# Patient Record
Sex: Male | Born: 2000 | Race: White | Hispanic: No | Marital: Single | Smoking: Never smoker
Health system: Southern US, Community
[De-identification: ages and names within clinical notes are randomized; demographics above are authoritative.]

## PROBLEM LIST (undated history)

## (undated) DIAGNOSIS — G43909 Migraine, unspecified, not intractable, without status migrainosus: Secondary | ICD-10-CM

## (undated) HISTORY — DX: Migraine, unspecified, not intractable, without status migrainosus: G43.909

## (undated) HISTORY — PX: WISDOM TOOTH EXTRACTION: SHX21

## (undated) HISTORY — PX: TONSILLECTOMY: SUR1361

---
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
## 2019-03-15 ENCOUNTER — Other Ambulatory Visit: Payer: Self-pay

## 2019-03-18 ENCOUNTER — Ambulatory Visit (INDEPENDENT_AMBULATORY_CARE_PROVIDER_SITE_OTHER): Payer: Managed Care, Other (non HMO) | Admitting: Family Medicine

## 2019-03-18 ENCOUNTER — Encounter: Payer: Self-pay | Admitting: Family Medicine

## 2019-03-18 ENCOUNTER — Other Ambulatory Visit: Payer: Self-pay

## 2019-03-18 VITALS — BP 114/70 | HR 83 | Temp 98.4°F | Ht 67.75 in | Wt 229.0 lb

## 2019-03-18 DIAGNOSIS — Z0000 Encounter for general adult medical examination without abnormal findings: Secondary | ICD-10-CM

## 2019-03-18 NOTE — Patient Instructions (Addendum)

## 2019-03-18 NOTE — Progress Notes (Signed)
Subjective:     Patient ID: Isaac Carlson, male   DOB: 2000-02-14, 19 y.o.   MRN: [id number]  HPI Alastair is seen for physical exam.  He is currently working at [employer] part-time.  He plans to enroll in college again later.  He went to [facility] [facility] for a brief while.  He will need tetanus booster next year.  Declines flu vaccine.  No history of smoking.  No illicit drug use.  No other nicotine use.  He has gained substantial amount of weight this past year which she attributes to the quarantine.  He has made some big changes in terms of portion control and reducing snack foods over the past few weeks and started to see some weight loss.  He is also tried exercise more  Past Medical History:  Diagnosis Date  . Migraine    Past Surgical History:  Procedure Laterality Date  . TONSILLECTOMY    . WISDOM TOOTH EXTRACTION      reports that he has never smoked. He has never used smokeless tobacco. He reports that he does not drink alcohol or use drugs. family history is not on file. No Known Allergies   Review of Systems  Constitutional: Negative for activity change, appetite change, fatigue and fever.  HENT: Negative for congestion, ear pain and trouble swallowing.   Eyes: Negative for pain and visual disturbance.  Respiratory: Negative for cough, shortness of breath and wheezing.   Cardiovascular: Negative for chest pain and palpitations.  Gastrointestinal: Negative for abdominal distention, abdominal pain, blood in stool, constipation, diarrhea, nausea, rectal pain and vomiting.  Endocrine: Negative for polydipsia and polyuria.  Genitourinary: Negative for dysuria, hematuria and testicular pain.  Musculoskeletal: Negative for arthralgias and joint swelling.  Skin: Negative for rash.  Neurological: Negative for dizziness, syncope and headaches.  Hematological: Negative for adenopathy.  Psychiatric/Behavioral: Negative for confusion and dysphoric mood.        Objective:   Physical Exam Vitals reviewed.  Constitutional:      General: He is not in acute distress.    Appearance: He is well-developed.  HENT:     Head: Normocephalic and atraumatic.     Right Ear: External ear normal.     Left Ear: External ear normal.  Eyes:     Conjunctiva/sclera: Conjunctivae normal.     Pupils: Pupils are equal, round, and reactive to light.  Neck:     Thyroid: No thyromegaly.  Cardiovascular:     Rate and Rhythm: Normal rate and regular rhythm.     Heart sounds: Normal heart sounds. No murmur.  Pulmonary:     Effort: No respiratory distress.     Breath sounds: No wheezing or rales.  Abdominal:     General: Bowel sounds are normal. There is no distension.     Palpations: Abdomen is soft. There is no mass.     Tenderness: There is no abdominal tenderness. There is no guarding or rebound.  Musculoskeletal:     Cervical back: Normal range of motion and neck supple.  Lymphadenopathy:     Cervical: No cervical adenopathy.  Skin:    Findings: No rash.  Neurological:     Mental Status: He is alert and oriented to person, place, and time.     Cranial Nerves: No cranial nerve deficit.        Assessment:     Physical exam.  He has increased BMI of 35 with substantial weight gain during the past year as above.  No significant chronic medical problems    Plan:     -Discussed healthy weight loss options.  He is trying to do this through combination of exercise and calorie restriction -We offered labs and he declines -Offered flu vaccine but he declines -Tetanus booster by next year  Kristian Covey MD [facility] at [facility]

## 2019-05-10 ENCOUNTER — Ambulatory Visit: Payer: Managed Care, Other (non HMO)

## 2019-09-12 ENCOUNTER — Other Ambulatory Visit: Payer: Self-pay

## 2019-09-12 DIAGNOSIS — Z20822 Contact with and (suspected) exposure to covid-19: Secondary | ICD-10-CM

## 2019-09-13 LAB — SARS-COV-2, NAA 2 DAY TAT

## 2019-09-13 LAB — NOVEL CORONAVIRUS, NAA: SARS-CoV-2, NAA: DETECTED — AB

## 2019-09-17 ENCOUNTER — Encounter: Payer: Self-pay | Admitting: Family Medicine

## 2019-09-19 ENCOUNTER — Telehealth (INDEPENDENT_AMBULATORY_CARE_PROVIDER_SITE_OTHER): Payer: Managed Care, Other (non HMO) | Admitting: Family Medicine

## 2019-09-19 ENCOUNTER — Encounter: Payer: Self-pay | Admitting: Family Medicine

## 2019-09-19 DIAGNOSIS — U071 COVID-19: Secondary | ICD-10-CM

## 2019-09-19 NOTE — Progress Notes (Signed)
Virtual Visit via Video Note  I connected with Isaac Carlson  on 09/19/19 at  1:00 PM EDT by a video enabled telemedicine application and verified that I am speaking with the correct person using two identifiers.  Location patient: home, [place] Location provider:work or home office Persons participating in the virtual visit: patient, provider  I discussed the limitations of evaluation and management by telemedicine and the availability of in person appointments. The patient expressed understanding and agreed to proceed.   HPI:  Acute telemedicine visit for COVID19: -he, his mom and sister all have COVID19 -started 8 days ago -fevers initially, ha the first few days, sinus pressure, cough - but better today, body aches initially, loss of taste and smell -denies: SOB, CP, HA now, difficulty getting out of bed, inability to tol oral intake -overall feeling better now -was not vaccinated for COVID19 -denies any risk factors for COVID19   ROS: See pertinent positives and negatives per HPI.  Past Medical History:  Diagnosis Date  . Migraine     Past Surgical History:  Procedure Laterality Date  . TONSILLECTOMY    . WISDOM TOOTH EXTRACTION      History reviewed. No pertinent family history.  SOCIAL HX: see hpi   Current Outpatient Medications:  .  EPINEPHrine (EPIPEN) 0.3 mg/0.3 mL DEVI, Inject 0.3 mLs (0.3 mg total) into the muscle once., Disp: 1 Device, Rfl: 1 .  loratadine (CLARITIN) 5 MG chewable tablet, Chew 5 mg by mouth daily., Disp: , Rfl:  .  Pediatric Multiple Vit-C-FA (MULTIVITAMIN ANIMAL SHAPES, WITH CA/FA,) WITH C & FA CHEW, Chew 1 tablet by mouth daily., Disp: , Rfl:   EXAM:  VITALS per patient if applicable:  GENERAL: alert, oriented, appears well and in no acute distress  HEENT: atraumatic, conjunttiva clear, no obvious abnormalities on inspection of external nose and ears  NECK: normal movements of the head and neck  LUNGS: on inspection no signs of respiratory  distress, breathing rate appears normal, no obvious gross SOB, gasping or wheezing  CV: no obvious cyanosis  MS: moves all visible extremities without noticeable abnormality  PSYCH/NEURO: pleasant and cooperative, no obvious depression or anxiety, speech and thought processing grossly intact  ASSESSMENT AND PLAN:  Discussed the following assessment and plan:  Infection due to 2019 novel coronavirus  -we discussed possible serious and likely etiologies, options for evaluation and workup, limitations of telemedicine visit vs in person visit, treatment, treatment risks and precautions. Pt prefers to treat via telemedicine empirically rather then risking or undertaking an in person visit at this moment. Seems to have had milder symptoms with COVID19 and today reports is feel much better. Appear well during this video visit call. Discussed treatment options, course, potential complications, isolation and precautions. Work/School slipped offered:in patient insturctions Advised to seek prompt follow up telemedicine visit or in person care if worsening, new symptoms arise, or if is not improving with treatment.  Greater than 20 minutes spent on this visit. I discussed the assessment and treatment plan with the patient. The patient was provided an opportunity to ask questions and all were answered. The patient agreed with the plan and demonstrated an understanding of the instructions.   The patient was advised to call back or seek an in-person evaluation if the symptoms worsen or if the condition fails to improve as anticipated.   Terressa Koyanagi, DO

## 2019-09-19 NOTE — Patient Instructions (Signed)
I am glad you are feeling better.  Please stay home for a full 10 days from the onset of symptoms in isolation, except to seek medical care if needed.   Seek care promptly inperson or by telemedicine visit if your symptoms recur or new concerns arise.

## 2020-08-31 ENCOUNTER — Ambulatory Visit (INDEPENDENT_AMBULATORY_CARE_PROVIDER_SITE_OTHER): Payer: 59 | Admitting: Psychiatry

## 2020-08-31 ENCOUNTER — Encounter: Payer: Self-pay | Admitting: Psychiatry

## 2020-08-31 ENCOUNTER — Other Ambulatory Visit: Payer: Self-pay

## 2020-08-31 VITALS — BP 151/77 | HR 70 | Ht 69.0 in | Wt 231.0 lb

## 2020-08-31 DIAGNOSIS — F4010 Social phobia, unspecified: Secondary | ICD-10-CM | POA: Diagnosis not present

## 2020-08-31 MED ORDER — SERTRALINE HCL 50 MG PO TABS: ORAL_TABLET | ORAL | 1 refills | Status: DC

## 2020-08-31 MED ORDER — PROPRANOLOL HCL 20 MG PO TABS: ORAL_TABLET | ORAL | 1 refills | Status: DC

## 2020-08-31 NOTE — Progress Notes (Signed)
[facility] MD/PA/NP Initial Note  08/31/2020 7:22 PM Isaac Carlson  MRN:  [id number]  Chief Complaint:  Chief Complaint   Establish Care; Anxiety; Stress     HPI: Referrred by friend of mother's Isaac Carlson.   Previously seeing Isaac Carlson for therapy.  A lot of stress and anxiety about school and work.  Things that didn't bother him in the past. Gotten worse since last summer without a specific trigger.  Always been kind of timid and social anxiety at school is hard.  Example presenting in class.  Social anxiety as long as he can remember and hates group activities. At work is Production designer, theatre/television/film at [employer] and has to talk to people all day causing too much stress and may have to leave for 5 mins to collect himself.  Not opposed to meds. Occ panic and about 3-4 in the last 4 mos with N, shakey.   Social avvoidant.  Not much tgrouble with depression. Some days low motivation.  First semester of college hard to go to classes. At [employer] this semester for 2nd year. No problems with sleep.  Appetite is OK and energy.  Couple years before HS would get NV before school.  Negative GI work up resolved.  Visit Diagnosis:    ICD-10-CM   1. 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 Counseling  with Isaac Carlson over 3 mos.  Past Medical History: No other medical problems Past Medical History:  Diagnosis Date   Migraine     Past Surgical History:  Procedure Laterality Date   TONSILLECTOMY     WISDOM TOOTH EXTRACTION      Family Psychiatric History: Mo history of anxiety meds briefly. 1 younger sister without mental health problems. GF PTSD from Isaac Carlson.  Family History:  Family History  Problem Relation Age of Onset   Anxiety disorder Mother     Social History:  Social History   Socioeconomic History   Marital status: Single    Spouse name: Not on file    Number of children: Not on file   Years of education: Not on file   Highest education level: Not on file  Occupational History   Not on file  Tobacco Use   Smoking status: Never   Smokeless tobacco: Never  Vaping Use   Vaping Use: Never used  Substance and Sexual Activity   Alcohol use: No   Drug use: No   Sexual activity: Never  Other Topics Concern   Not on file  Social History Narrative   Has GF.   No kids   Social Determinants of Corporate investment banker Strain: Not on file  Food Insecurity: Not on file  Transportation Needs: Not on file  Physical Activity: Not on file  Stress: Not on file  Social Connections: Not on file    Allergies: No Known Allergies  Metabolic Disorder Labs: No results found for: HGBA1C, MPG No results found for: PROLACTIN No results found for: CHOL, TRIG, HDL, CHOLHDL, VLDL, LDLCALC Lab Results  Component Value Date   TSH 1.17 03/31/2017    Therapeutic Level Labs: No results found for: LITHIUM No results found for: VALPROATE No components found for:  CBMZ  Current Medications: Current Outpatient Medications  Medication Sig Dispense Refill   propranolol (INDERAL) 20 MG tablet 1-2 tablets twice daily as needed for anxiety 120 tablet 1  sertraline (ZOLOFT) 50 MG tablet 1/2 tablet daily for 1 week then 1 daily 30 tablet 1   EPINEPHrine (EPIPEN) 0.3 mg/0.3 mL DEVI Inject 0.3 mLs (0.3 mg total) into the muscle once. (Patient not taking: Reported on 08/31/2020) 1 Device 1   loratadine (CLARITIN) 5 MG chewable tablet Chew 5 mg by mouth daily. (Patient not taking: Reported on 08/31/2020)     Pediatric Multiple Vit-C-FA (MULTIVITAMIN ANIMAL SHAPES, WITH CA/FA,) WITH C & FA CHEW Chew 1 tablet by mouth daily. (Patient not taking: Reported on 08/31/2020)     No current facility-administered medications for this visit.    Medication Side Effects: none  Orders placed this visit:  No orders of the defined types were placed in this  encounter.   Psychiatric Specialty Exam:  Review of Systems  Constitutional:  Negative for diaphoresis, fatigue and unexpected weight change.  HENT:  Positive for congestion and sinus pain. Negative for facial swelling, hearing loss, tinnitus, trouble swallowing and voice change.   Eyes:  Negative for pain and visual disturbance.  Respiratory:  Negative for chest tightness, shortness of breath and wheezing.   Cardiovascular:  Negative for chest pain and palpitations.  Gastrointestinal:  Negative for abdominal distention, constipation, diarrhea, nausea and vomiting.  Endocrine: Negative for polyuria.  Genitourinary:  Negative for decreased urine volume, difficulty urinating, flank pain, frequency and urgency.  Musculoskeletal:  Negative for arthralgias, gait problem and neck pain.  Skin:  Negative for rash.  Allergic/Immunologic: Positive for environmental allergies.  Neurological:  Positive for dizziness and headaches. Negative for tremors and weakness.  Psychiatric/Behavioral:  Negative for agitation, behavioral problems, confusion, decreased concentration, dysphoric mood, hallucinations, sleep disturbance and suicidal ideas.    Blood pressure (!) 151/77, pulse 70, height 5\' 9"  (1.753 m), weight 231 lb (104.8 kg).Body mass index is 34.11 kg/m.  General Appearance: Casual and Well Groomed  Eye Contact:  Good  Speech:  Normal Rate  Volume:  Normal  Mood:  Anxious  Affect:  Appropriate and Constricted  Thought Process:  Coherent, Goal Directed, and Descriptions of Associations: Intact  Orientation:  Full (Time, Place, and Person)  Thought Content: Logical and Hallucinations: None   Suicidal Thoughts:  No  Homicidal Thoughts:  No  Memory:  WNL  Judgement:  Good  Insight:  Good  Psychomotor Activity:  Normal  Concentration:  Concentration: Good  Recall:  Good  Fund of Knowledge: Good  Language: Good  Assets:  Communication Skills Desire for Improvement Financial  Resources/Insurance Housing Intimacy Leisure Time Physical Health Resilience Social Support Talents/Skills Transportation Vocational/Educational  ADL's:  Intact  Cognition: WNL  Prognosis:  Good   Screenings:  [place] Office Visit from 03/18/2019 in [facility] [place] Office Visit from 03/12/2018 in [facility] [place]  PHQ-2 Total Score 2 0  PHQ-9 Total Score 9 0      MDQ negative for manic sx and pt denies manic history  Receiving Psychotherapy:  past history 05/2018  Treatment Plan/Recommendations: Greater than 50% of 45 min face to face time with patient was spent on counseling and coordination of care. We discussed  Extensive discussion about his primary diagnosis of social anxiety disorder with potential Covid occurring generalized anxiety disorder.  He also have performance anxiety disorder.  We discussed the treatment options for these conditions.  There is an FDA approved treatment option for social anxiety disorder which is sertraline.  Propranolol is often used prefer for performance anxiety and social anxiety disorder situationally.  We discussed the pros and cons of each medication as well as side effects of each medication.  We discussed the time course to improvement with each medication.  He agrees to start sertraline.  We will proceed with low dosages initially and if he does not have adequate response or significant response with the sertraline in the first month he should call back and we should increase the dose.  Sertraline 25 mg daily for 1 week then 50 mg daily  Propranolol 20 to 40 mg twice daily as needed anxiety.  Disc SE each  Disc SE in detail and SSRI withdrawal sx.  FU 6-8 weeks   Lauraine Rinne, MD

## 2020-09-22 ENCOUNTER — Other Ambulatory Visit: Payer: Self-pay | Admitting: Psychiatry

## 2020-09-22 DIAGNOSIS — F4010 Social phobia, unspecified: Secondary | ICD-10-CM

## 2020-10-21 ENCOUNTER — Ambulatory Visit (INDEPENDENT_AMBULATORY_CARE_PROVIDER_SITE_OTHER): Payer: 59 | Admitting: Psychiatry

## 2020-10-21 ENCOUNTER — Other Ambulatory Visit: Payer: Self-pay

## 2020-10-21 ENCOUNTER — Encounter: Payer: Self-pay | Admitting: Psychiatry

## 2020-10-21 DIAGNOSIS — F4010 Social phobia, unspecified: Secondary | ICD-10-CM

## 2020-10-21 NOTE — Progress Notes (Signed)
Isaac Carlson [id number] 2000-12-02 20 y.o.  Subjective:   Patient ID:  Isaac Carlson is a 20 y.o. (DOB January 19, 2001) male.  Chief Complaint:  Chief Complaint  Patient presents with   Follow-up   Anxiety    HPI [name] presents to the office today for follow-up of social anxiety disorder. Referred by a friend of his mothers, [name] and previously saw Delsa Bern for psychotherapy. Originally seen 08/31/2020.  10/21/2020 appointment with the following noted: At his first appointment he was prescribed sertraline 50 mg daily and propranolol 20 mg to 40 mg twice daily as needed anxiety. A little sleepy from sertraline 50 in AM.   Lately a little hard to stay asleep last 3 week but right back to sleep. 6-8 hours of sleep. Definitely at school anxiety is better esp walking around and into class.  Work is hectic ans stress there will cause anxiety worse.  Anxiety gradually better. Has used propranolol an hour before work 20 mg with some benefit.   Past Psychiatric Medication Trials: None.  PHQ2-9    Flowsheet Row Office Visit from 03/18/2019 in [facility] at [facility] from 03/12/2018 in [facility] at [facility] Total Score 2 0  PHQ-9 Total Score 9 0        Review of Systems:  Review of Systems  Gastrointestinal:  Negative for diarrhea.  Neurological:  Negative for tremors.   Medications: I have reviewed the patient's current medications.  Current Outpatient Medications  Medication Sig Dispense Refill   loratadine (CLARITIN) 5 MG chewable tablet Chew 5 mg by mouth daily.     propranolol (INDERAL) 20 MG tablet TAKE 1-2 TABLETS TWICE DAILY AS NEEDED FOR ANXIETY 120 tablet 0   sertraline (ZOLOFT) 50 MG tablet Take 1 tablet (50 mg total) by mouth daily. 90 tablet 0   EPINEPHrine (EPIPEN) 0.3 mg/0.3 mL DEVI Inject 0.3 mLs (0.3 mg total) into the muscle once. (Patient not taking: No sig reported) 1 Device 1    No current facility-administered medications for this visit.    Medication Side Effects: Sedation  Allergies: No Known Allergies  Past Medical History:  Diagnosis Date   Migraine     Past Medical History, Surgical history, Social history, and Family history were reviewed and updated as appropriate.   Please see review of systems for further details on the patient's review from today.   Objective:   Physical Exam:  There were no vitals taken for this visit.  Physical Exam Constitutional:      General: He is not in acute distress. Musculoskeletal:        General: No deformity.  Neurological:     Mental Status: He is alert and oriented to person, place, and time.     Coordination: Coordination normal.  Psychiatric:        Attention and Perception: Attention and perception normal. He does not perceive auditory or visual hallucinations.        Mood and Affect: Mood is anxious. Mood is not depressed. Affect is not labile, blunt, angry or inappropriate.        Speech: Speech normal.        Behavior: Behavior normal.        Thought Content: Thought content normal. Thought content is not paranoid or delusional. Thought content does not include homicidal or suicidal ideation. Thought content does not include homicidal or suicidal plan.        Cognition and Memory: Cognition and memory  normal.        Judgment: Judgment normal.     Comments: Insight intact    Lab Review:     Component Value Date/Time   NA 138 05/25/2017 1405   K 3.6 05/25/2017 1405   CL 104 05/25/2017 1405   CO2 24 05/25/2017 1405   GLUCOSE 87 05/25/2017 1405   BUN 9 05/25/2017 1405   CREATININE 0.85 05/25/2017 1405   CALCIUM 8.9 05/25/2017 1405   PROT 7.6 05/25/2017 1405   ALBUMIN 4.7 05/25/2017 1405   AST 21 05/25/2017 1405   ALT 32 05/25/2017 1405   ALKPHOS 54 05/25/2017 1405   BILITOT 0.9 05/25/2017 1405   GFRNONAA NOT CALCULATED 05/25/2017 1405   GFRAA NOT CALCULATED 05/25/2017 1405        Component Value Date/Time   WBC 8.4 05/25/2017 1405   RBC 5.21 05/25/2017 1405   HGB 16.3 (H) 05/25/2017 1405   HCT 47.3 05/25/2017 1405   PLT 267 05/25/2017 1405   MCV 90.8 05/25/2017 1405   MCH 31.3 05/25/2017 1405   MCHC 34.5 05/25/2017 1405   RDW 12.6 05/25/2017 1405   LYMPHSABS 2.0 03/31/2017 1158   MONOABS 0.6 03/31/2017 1158   EOSABS 0.2 03/31/2017 1158   BASOSABS 0.0 03/31/2017 1158    No results found for: POCLITH, LITHIUM   No results found for: PHENYTOIN, PHENOBARB, VALPROATE, CBMZ   .res Assessment: Plan:    Nero was seen today for follow-up and anxiety.  Diagnoses and all orders for this visit:  Social anxiety disorder   Switch sertraline to night and increase dose for better benefit for anxiety DT partial response. Increase to 75 mg then 100 mg if tolerated for better control.  Option increase propranolol 20-40 mg prn anxiety. Disc SE  FU 2 mos  Meredith Staggers, MD, DFAPA    Please see After Visit Summary for patient specific instructions.  No future appointments.  No orders of the defined types were placed in this encounter.   -------------------------------

## 2020-10-24 ENCOUNTER — Other Ambulatory Visit: Payer: Self-pay | Admitting: Psychiatry

## 2020-10-24 DIAGNOSIS — F4010 Social phobia, unspecified: Secondary | ICD-10-CM

## 2020-10-27 ENCOUNTER — Telehealth: Payer: Self-pay | Admitting: Psychiatry

## 2020-10-27 ENCOUNTER — Other Ambulatory Visit: Payer: Self-pay

## 2020-10-27 DIAGNOSIS — F4010 Social phobia, unspecified: Secondary | ICD-10-CM

## 2020-10-27 MED ORDER — SERTRALINE HCL 50 MG PO TABS: 50.0000 mg | ORAL_TABLET | Freq: Every day | ORAL | 0 refills | Status: DC

## 2020-10-27 NOTE — Telephone Encounter (Signed)
Rx sent 

## 2020-10-27 NOTE — Telephone Encounter (Signed)
Next visit is 12/31/20. Requesting refill for Sertraline 50 mg. He has been out of it for a few days. Pharmacy is:  [id number] - [place],  - [address]  Phone:  [phone]  Fax:  [phone]

## 2020-11-20 ENCOUNTER — Ambulatory Visit (INDEPENDENT_AMBULATORY_CARE_PROVIDER_SITE_OTHER): Payer: Managed Care, Other (non HMO) | Admitting: Family Medicine

## 2020-11-20 ENCOUNTER — Other Ambulatory Visit: Payer: Self-pay

## 2020-11-20 VITALS — BP 118/68 | HR 90 | Temp 98.2°F | Ht 69.0 in | Wt 234.1 lb

## 2020-11-20 DIAGNOSIS — Z0000 Encounter for general adult medical examination without abnormal findings: Secondary | ICD-10-CM | POA: Diagnosis not present

## 2020-11-20 NOTE — Progress Notes (Signed)
Established Patient Office Visit  Subjective:  Patient ID: Isaac Carlson, male    DOB: [birth year]  Age: 20 y.o. MRN: [id number]  CC:  Chief Complaint  Patient presents with   Annual Exam    HPI Isaac Carlson presents for physical exam.  He has history of some chronic social type anxiety and is followed by psychiatry for that.  Currently on sertraline and symptoms are stable.  He is currently attending local community college and also working part-time.  Last tetanus reportedly 10 years ago.  He plans to get flu vaccine possibly next week.  Declines hepatitis C testing.  Low risk.  Family history reviewed with no significant changes.  No premature heart disease or diabetes.  No cancer history.  Social history-single.  Non-smoker.  No illicit drug use.  Attends [employer].  Past Medical History:  Diagnosis Date   Migraine     Past Surgical History:  Procedure Laterality Date   TONSILLECTOMY     WISDOM TOOTH EXTRACTION      Family History  Problem Relation Age of Onset   Anxiety disorder Mother     Social History   Socioeconomic History   Marital status: Single    Spouse name: Not on file   Number of children: Not on file   Years of education: Not on file   Highest education level: Not on file  Occupational History   Not on file  Tobacco Use   Smoking status: Never   Smokeless tobacco: Never  Vaping Use   Vaping Use: Never used  Substance and Sexual Activity   Alcohol use: No   Drug use: No   Sexual activity: Never  Other Topics Concern   Not on file  Social History Narrative   Has GF.   No kids   Social Determinants of Corporate investment banker Strain: Not on file  Food Insecurity: Not on file  Transportation Needs: Not on file  Physical Activity: Not on file  Stress: Not on file  Social Connections: Not on file  Intimate Partner Violence: Not on file    Outpatient Medications Prior to Visit   Medication Sig Dispense Refill   EPINEPHrine (EPIPEN) 0.3 mg/0.3 mL DEVI Inject 0.3 mLs (0.3 mg total) into the muscle once. 1 Device 1   loratadine (CLARITIN) 5 MG chewable tablet Chew 5 mg by mouth daily.     propranolol (INDERAL) 20 MG tablet TAKE 1-2 TABLETS TWICE DAILY AS NEEDED FOR ANXIETY 120 tablet 2   sertraline (ZOLOFT) 50 MG tablet Take 1 tablet (50 mg total) by mouth daily. 90 tablet 0   No facility-administered medications prior to visit.    No Known Allergies  ROS Review of Systems  Constitutional:  Negative for activity change, appetite change, fatigue and fever.  HENT:  Negative for congestion, ear pain and trouble swallowing.   Eyes:  Negative for pain and visual disturbance.  Respiratory:  Negative for cough, shortness of breath and wheezing.   Cardiovascular:  Negative for chest pain and palpitations.  Gastrointestinal:  Negative for abdominal distention, abdominal pain, blood in stool, constipation, diarrhea, nausea, rectal pain and vomiting.  Endocrine: Negative for polydipsia and polyuria.  Genitourinary:  Negative for dysuria, hematuria and testicular pain.  Musculoskeletal:  Negative for arthralgias and joint swelling.  Skin:  Negative for rash.  Neurological:  Negative for dizziness, syncope and headaches.  Hematological:  Negative for adenopathy.  Psychiatric/Behavioral:  Negative for confusion and dysphoric mood.  Objective:    Physical Exam Constitutional:      General: He is not in acute distress.    Appearance: He is well-developed.  HENT:     Head: Normocephalic and atraumatic.     Ears:     Comments: Does have some cerumen right canal obstructing view of eardrum Eyes:     Conjunctiva/sclera: Conjunctivae normal.     Pupils: Pupils are equal, round, and reactive to light.  Neck:     Thyroid: No thyromegaly.  Cardiovascular:     Rate and Rhythm: Normal rate and regular rhythm.     Heart sounds: Normal heart sounds. No murmur  heard. Pulmonary:     Effort: No respiratory distress.     Breath sounds: No wheezing or rales.  Abdominal:     General: Bowel sounds are normal. There is no distension.     Palpations: Abdomen is soft. There is no mass.     Tenderness: There is no abdominal tenderness. There is no guarding or rebound.  Musculoskeletal:     Cervical back: Normal range of motion and neck supple.     Right lower leg: No edema.     Left lower leg: No edema.  Lymphadenopathy:     Cervical: No cervical adenopathy.  Skin:    Findings: No rash.  Neurological:     Mental Status: He is alert and oriented to person, place, and time.     Cranial Nerves: No cranial nerve deficit.    BP 118/68 (BP Location: Left Arm, Cuff Size: Normal)   Pulse 90   Temp 98.2 F (36.8 C) (Oral)   Ht 5\' 9"  (1.753 m)   Wt 234 lb 1.6 oz (106.2 kg)   SpO2 98%   BMI 34.57 kg/m  Wt Readings from Last 3 Encounters:  11/20/20 234 lb 1.6 oz (106.2 kg)  03/18/19 229 lb (103.9 kg) (98 %, Z= 2.06)*  03/12/18 189 lb 3.2 oz (85.8 kg) (90 %, Z= 1.27)*   * Growth percentiles are based on CDC (Boys, 2-20 Years) data.     Health Maintenance Due  Topic Date Due   COVID-19 Vaccine (1) Never done   HPV VACCINES (1 - Male 2-dose series) Never done   HIV Screening  Never done       Topic Date Due   HPV VACCINES (1 - Male 2-dose series) Never done    Lab Results  Component Value Date   TSH 1.17 03/31/2017   Lab Results  Component Value Date   WBC 8.4 05/25/2017   HGB 16.3 (H) 05/25/2017   HCT 47.3 05/25/2017   MCV 90.8 05/25/2017   PLT 267 05/25/2017   Lab Results  Component Value Date   NA 138 05/25/2017   K 3.6 05/25/2017   CO2 24 05/25/2017   GLUCOSE 87 05/25/2017   BUN 9 05/25/2017   CREATININE 0.85 05/25/2017   BILITOT 0.9 05/25/2017   ALKPHOS 54 05/25/2017   AST 21 05/25/2017   ALT 32 05/25/2017   PROT 7.6 05/25/2017   ALBUMIN 4.7 05/25/2017   CALCIUM 8.9 05/25/2017   ANIONGAP 10 05/25/2017   GFR 129.02  03/31/2017   No results found for: CHOL No results found for: HDL No results found for: LDLCALC No results found for: TRIG No results found for: CHOLHDL No results found for: 05/31/2017    Assessment & Plan:   Physical exam.  Generally doing well though he has gained some steady weight over the past couple years.  He has some chronic anxiety controlled with sertraline.  We discussed the following health maintenance items  -Needs tetanus booster.  He declines today but agrees to get this soon.  He was going out of town today and did not wish to get this at this time -Also recommended flu vaccine but he declines at this time -We discussed screening labs and he declines -We strongly challenged him to try to lose some weight through reducing overall calories and increasing exercise  No orders of the defined types were placed in this encounter.   Follow-up: No follow-ups on file.    Evelena Peat, MD

## 2020-11-20 NOTE — Patient Instructions (Signed)
Consider Tetanus booster at some point this year

## 2020-12-27 ENCOUNTER — Other Ambulatory Visit: Payer: Self-pay | Admitting: Psychiatry

## 2020-12-27 DIAGNOSIS — F4010 Social phobia, unspecified: Secondary | ICD-10-CM

## 2020-12-31 ENCOUNTER — Encounter: Payer: Self-pay | Admitting: Psychiatry

## 2020-12-31 ENCOUNTER — Other Ambulatory Visit: Payer: Self-pay

## 2020-12-31 ENCOUNTER — Ambulatory Visit (INDEPENDENT_AMBULATORY_CARE_PROVIDER_SITE_OTHER): Payer: 59 | Admitting: Psychiatry

## 2020-12-31 DIAGNOSIS — F4010 Social phobia, unspecified: Secondary | ICD-10-CM | POA: Diagnosis not present

## 2020-12-31 MED ORDER — SERTRALINE HCL 100 MG PO TABS: 150.0000 mg | ORAL_TABLET | Freq: Every day | ORAL | 1 refills | Status: DC

## 2020-12-31 NOTE — Progress Notes (Signed)
Isaac Carlson [id number] Mar 06, 2000 20 y.o.  Subjective:   Patient ID:  Isaac Carlson is a 20 y.o. (DOB March 09, 2000) male.  Chief Complaint:  Chief Complaint  Patient presents with   Follow-up   Anxiety    Social anxiety disorder    HPI [name] presents to the office today for follow-up of social anxiety disorder. Referred by a friend of his mothers, [name] and previously saw Maggie Font [facility] for psychotherapy. Originally seen 08/31/2020.  10/21/2020 appointment with the following noted: At his first appointment he was prescribed sertraline 50 mg daily and propranolol 20 mg to 40 mg twice daily as needed anxiety. A little sleepy from sertraline 50 in AM.   Lately a little hard to stay asleep last 3 week but right back to sleep. 6-8 hours of sleep. Definitely at school anxiety is better esp walking around and into class.  Work is hectic and stress there will cause anxiety worse.  Anxiety gradually better. Has used propranolol an hour before work 20 mg with some benefit. Plan: Switch sertraline to night and increase dose for better benefit for anxiety DT partial response. Increase to 75 mg then 100 mg if tolerated for better control. Option increase propranolol 20-40 mg prn anxiety.  12/31/2020 appointment with the following noted: Increased sertraline to 75 mg daily at HS. New job [employer]  2 weeks ago and likes it better so far.  More physical. SE less sleepy.  No other SE. Took propranolol last week for presentation and it helped at 20 mg . When goes out with friends and family not as worried as much socially. Still anxious at work to ask questions bc doesn't want to look stupid.   School ending this week and will complete this school.  May decide to go further. Less worry causing procrastination.  Past Psychiatric Medication Trials: None.  PHQ2-9    Flowsheet Row Office Visit from 11/20/2020 in [facility] at  [facility] from 03/18/2019 in [facility] at [facility] from 03/12/2018 in [facility] at [facility] Total Score 3 2 0  PHQ-9 Total Score 9 9 0        Review of Systems:  Review of Systems  Gastrointestinal:  Negative for diarrhea.  Neurological:  Negative for tremors.  Psychiatric/Behavioral:  The patient is nervous/anxious.    Medications: I have reviewed the patient's current medications.  Current Outpatient Medications  Medication Sig Dispense Refill   EPINEPHrine (EPIPEN) 0.3 mg/0.3 mL DEVI Inject 0.3 mLs (0.3 mg total) into the muscle once. 1 Device 1   loratadine (CLARITIN) 5 MG chewable tablet Chew 5 mg by mouth daily.     propranolol (INDERAL) 20 MG tablet TAKE 1-2 TABLETS TWICE DAILY AS NEEDED FOR ANXIETY 120 tablet 2   sertraline (ZOLOFT) 100 MG tablet Take 1.5 tablets (150 mg total) by mouth daily. 135 tablet 1   No current facility-administered medications for this visit.    Medication Side Effects: Sedation  Allergies: No Known Allergies  Past Medical History:  Diagnosis Date   Migraine     Past Medical History, Surgical history, Social history, and Family history were reviewed and updated as appropriate.   Please see review of systems for further details on the patient's review from today.   Objective:   Physical Exam:  There were no vitals taken for this visit.  Physical Exam Constitutional:      General: He is not in acute distress. Musculoskeletal:  General: No deformity.  Neurological:     Mental Status: He is alert and oriented to person, place, and time.     Coordination: Coordination normal.  Psychiatric:        Attention and Perception: Attention and perception normal. He does not perceive auditory or visual hallucinations.        Mood and Affect: Mood is anxious. Mood is not depressed. Affect is not labile, blunt, angry or inappropriate.        Speech: Speech normal.        Behavior:  Behavior normal.        Thought Content: Thought content normal. Thought content is not paranoid or delusional. Thought content does not include homicidal or suicidal ideation.        Cognition and Memory: Cognition and memory normal.        Judgment: Judgment normal.     Comments: Insight intact    Lab Review:     Component Value Date/Time   NA 138 05/25/2017 1405   K 3.6 05/25/2017 1405   CL 104 05/25/2017 1405   CO2 24 05/25/2017 1405   GLUCOSE 87 05/25/2017 1405   BUN 9 05/25/2017 1405   CREATININE 0.85 05/25/2017 1405   CALCIUM 8.9 05/25/2017 1405   PROT 7.6 05/25/2017 1405   ALBUMIN 4.7 05/25/2017 1405   AST 21 05/25/2017 1405   ALT 32 05/25/2017 1405   ALKPHOS 54 05/25/2017 1405   BILITOT 0.9 05/25/2017 1405   GFRNONAA NOT CALCULATED 05/25/2017 1405   GFRAA NOT CALCULATED 05/25/2017 1405       Component Value Date/Time   WBC 8.4 05/25/2017 1405   RBC 5.21 05/25/2017 1405   HGB 16.3 (H) 05/25/2017 1405   HCT 47.3 05/25/2017 1405   PLT 267 05/25/2017 1405   MCV 90.8 05/25/2017 1405   MCH 31.3 05/25/2017 1405   MCHC 34.5 05/25/2017 1405   RDW 12.6 05/25/2017 1405   LYMPHSABS 2.0 03/31/2017 1158   MONOABS 0.6 03/31/2017 1158   EOSABS 0.2 03/31/2017 1158   BASOSABS 0.0 03/31/2017 1158    No results found for: POCLITH, LITHIUM   No results found for: PHENYTOIN, PHENOBARB, VALPROATE, CBMZ   .res Assessment: Plan:    Khaleed was seen today for follow-up and anxiety.  Diagnoses and all orders for this visit:  Social anxiety disorder -     sertraline (ZOLOFT) 100 MG tablet; Take 1.5 tablets (150 mg total) by mouth daily.   Switch sertraline to night and increase dose for better benefit for anxiety DT partial response. Increase to 100 mg  and after a month if tolerated for better control can increase to 150 mg daily. In general this works better at higher doses for anxiety  Disc SE in detail.  Disc SE in detail and SSRI withdrawal sx.  continue increase  propranolol 20-40 mg prn anxiety. Disc SE  FU 2-3 mos  Meredith Staggers, MD, DFAPA    Please see After Visit Summary for patient specific instructions.  No future appointments.  No orders of the defined types were placed in this encounter.   -------------------------------

## 2021-03-10 ENCOUNTER — Other Ambulatory Visit: Payer: Self-pay

## 2021-03-10 ENCOUNTER — Ambulatory Visit (INDEPENDENT_AMBULATORY_CARE_PROVIDER_SITE_OTHER): Payer: 59 | Admitting: Psychiatry

## 2021-03-10 ENCOUNTER — Encounter: Payer: Self-pay | Admitting: Psychiatry

## 2021-03-10 DIAGNOSIS — F4010 Social phobia, unspecified: Secondary | ICD-10-CM

## 2021-03-10 MED ORDER — SERTRALINE HCL 100 MG PO TABS: 200.0000 mg | ORAL_TABLET | Freq: Every day | ORAL | 0 refills | Status: DC

## 2021-03-10 NOTE — Progress Notes (Signed)
Isaac Carlson [id number] 06-22-2000 20 y.o.  Subjective:   Patient ID:  Isaac Carlson is a 21 y.o. (DOB Nov 07, 2000) male.  Chief Complaint:  Chief Complaint  Patient presents with   Follow-up   Anxiety    HPI Isaac Carlson presents to the office today for follow-up of social anxiety disorder. Referred by a friend of his mothers, Isaac Carlson and previously saw Isaac Carlson [facility] for psychotherapy. Originally seen 08/31/2020.  10/21/2020 appointment with the following noted: At his first appointment he was prescribed sertraline 50 mg daily and propranolol 20 mg to 40 mg twice daily as needed anxiety. A little sleepy from sertraline 50 in AM.   Lately a little hard to stay asleep last 3 week but right back to sleep. 6-8 hours of sleep. Definitely at school anxiety is better esp walking around and into class.  Work is hectic and stress there will cause anxiety worse.  Anxiety gradually better. Has used propranolol an hour before work 20 mg with some benefit. Plan: Switch sertraline to night and increase dose for better benefit for anxiety DT partial response. Increase to 75 mg then 100 mg if tolerated for better control. Option increase propranolol 20-40 mg prn anxiety.  12/31/2020 appointment with the following noted: Increased sertraline to 75 mg daily at HS. New job [employer]  2 weeks ago and likes it better so far.  More physical. SE less sleepy.  No other SE. Took propranolol last week for presentation and it helped at 20 mg . When goes out with friends and family not as worried as much socially. Still anxious at work to ask questions bc doesn't want to look stupid.   School ending this week and will complete this school.  May decide to go further. Less worry causing procrastination. Plan: Switch sertraline to night and increase dose for better benefit for anxiety DT partial response. Increase to 100 mg  and after a month if tolerated for  better control can increase to 150 mg daily. continue increase propranolol 20-40 mg prn anxie  03/10/2021 appointment with the following noted: Increased sertraline 150 mg for about 6  weeks. Not much different.  Work is pretty easy socially.  Easier talking to people and good group of guys at work.   Still get sweaty and clammy socially.  Uses propranolol in that situation.  Usually just one.   Some issues with sleep lately.  Hard to stay asleep. Anxiety dropped from 8/10 to 45-5/10 with sertraline.   Past Psychiatric Medication Trials: Sertraline 150   PHQ2-9    Flowsheet Row Office Visit from 11/20/2020 in [facility] Office Visit from 03/18/2019 in [facility] Office Visit from 03/12/2018 in [facility] Total Score 3 2 0  PHQ-9 Total Score 9 9 0        Review of Systems:  Review of Systems  Constitutional:  Positive for diaphoresis.  Gastrointestinal:  Negative for diarrhea.  Neurological:  Negative for tremors.  Psychiatric/Behavioral:  The patient is nervous/anxious.    Medications: I have reviewed the patient's current medications.  Current Outpatient Medications  Medication Sig Dispense Refill   EPINEPHrine (EPIPEN) 0.3 mg/0.3 mL DEVI Inject 0.3 mLs (0.3 mg total) into the muscle once. 1 Device 1   loratadine (CLARITIN) 5 MG chewable tablet Chew 5 mg by mouth daily.     propranolol (INDERAL) 20 MG tablet TAKE 1-2 TABLETS TWICE DAILY AS NEEDED FOR ANXIETY 120 tablet  2   sertraline (ZOLOFT) 100 MG tablet Take 2 tablets (200 mg total) by mouth daily. 180 tablet 0   No current facility-administered medications for this visit.    Medication Side Effects: Sedation  Allergies: No Known Allergies  Past Medical History:  Diagnosis Date   Migraine     Past Medical History, Surgical history, Social history, and Family history were reviewed and updated as appropriate.   Please see review of systems for  further details on the patient's review from today.   Objective:   Physical Exam:  There were no vitals taken for this visit.  Physical Exam Constitutional:      General: He is not in acute distress. Musculoskeletal:        General: No deformity.  Neurological:     Mental Status: He is alert and oriented to person, place, and time.     Coordination: Coordination normal.  Psychiatric:        Attention and Perception: Attention and perception normal. He does not perceive auditory or visual hallucinations.        Mood and Affect: Mood is anxious. Mood is not depressed. Affect is not labile, blunt, angry or inappropriate.        Speech: Speech normal.        Behavior: Behavior normal.        Thought Content: Thought content normal. Thought content is not paranoid or delusional. Thought content does not include homicidal or suicidal ideation.        Cognition and Memory: Cognition and memory normal.        Judgment: Judgment normal.     Comments: Insight intact    Lab Review:     Component Value Date/Time   NA 138 05/25/2017 1405   K 3.6 05/25/2017 1405   CL 104 05/25/2017 1405   CO2 24 05/25/2017 1405   GLUCOSE 87 05/25/2017 1405   BUN 9 05/25/2017 1405   CREATININE 0.85 05/25/2017 1405   CALCIUM 8.9 05/25/2017 1405   PROT 7.6 05/25/2017 1405   ALBUMIN 4.7 05/25/2017 1405   AST 21 05/25/2017 1405   ALT 32 05/25/2017 1405   ALKPHOS 54 05/25/2017 1405   BILITOT 0.9 05/25/2017 1405   GFRNONAA NOT CALCULATED 05/25/2017 1405   GFRAA NOT CALCULATED 05/25/2017 1405       Component Value Date/Time   WBC 8.4 05/25/2017 1405   RBC 5.21 05/25/2017 1405   HGB 16.3 (H) 05/25/2017 1405   HCT 47.3 05/25/2017 1405   PLT 267 05/25/2017 1405   MCV 90.8 05/25/2017 1405   MCH 31.3 05/25/2017 1405   MCHC 34.5 05/25/2017 1405   RDW 12.6 05/25/2017 1405   LYMPHSABS 2.0 03/31/2017 1158   MONOABS 0.6 03/31/2017 1158   EOSABS 0.2 03/31/2017 1158   BASOSABS 0.0 03/31/2017 1158    No  results found for: POCLITH, LITHIUM   No results found for: PHENYTOIN, PHENOBARB, VALPROATE, CBMZ   .res Assessment: Plan:    [name] was seen today for follow-up and anxiety.  Diagnoses and all orders for this visit:  Social anxiety disorder -     sertraline (ZOLOFT) 100 MG tablet; Take 2 tablets (200 mg total) by mouth daily.    Switch sertraline to night and increase dose for better benefit for anxiety DT partial response. 200 mg daily.  In general this works better at higher doses for anxiety  Disc SE in detail.  Disc SE in detail and SSRI withdrawal sx.  Disc shift work sleep  issues.  He's going to bed 45 min after home from work  increase propranolol 40 mg prn anxiety. Disc SE  FU 2-3 mos  Meredith Staggers, MD, DFAPA    Please see After Visit Summary for patient specific instructions.  No future appointments.  No orders of the defined types were placed in this encounter.    -------------------------------

## 2021-06-15 ENCOUNTER — Ambulatory Visit (INDEPENDENT_AMBULATORY_CARE_PROVIDER_SITE_OTHER): Payer: 59 | Admitting: Psychiatry

## 2021-06-15 ENCOUNTER — Encounter: Payer: Self-pay | Admitting: Psychiatry

## 2021-06-15 DIAGNOSIS — F4010 Social phobia, unspecified: Secondary | ICD-10-CM

## 2021-06-15 MED ORDER — SERTRALINE HCL 100 MG PO TABS: 200.0000 mg | ORAL_TABLET | Freq: Every day | ORAL | 1 refills | Status: DC

## 2021-06-15 NOTE — Progress Notes (Signed)
Isaac CrawfordDylan Douglas Bottcher [id number] 2000/07/14 20 y.o.  Subjective:   Patient ID:  Isaac Carlson is a 21 y.o. (DOB 2000/07/14) male.  Chief Complaint:  Chief Complaint  Patient presents with   Follow-up   Anxiety    HPI Steven Balinda QuailsDouglas Giebel presents to the office today for follow-up of social anxiety disorder. Referred by a friend of his mothers, Johnathan HausenSteve Andrews and previously saw [facility]. Originally seen 08/31/2020.  10/21/2020 appointment with the following noted: At his first appointment he was prescribed sertraline 50 mg daily and propranolol 20 mg to 40 mg twice daily as needed anxiety. A little sleepy from sertraline 50 in AM.   Lately a little hard to stay asleep last 3 week but right back to sleep. 6-8 hours of sleep. Definitely at school anxiety is better esp walking around and into class.  Work is hectic and stress there will cause anxiety worse.  Anxiety gradually better. Has used propranolol an hour before work 20 mg with some benefit. Plan: Switch sertraline to night and increase dose for better benefit for anxiety DT partial response. Increase to 75 mg then 100 mg if tolerated for better control. Option increase propranolol 20-40 mg prn anxiety.  12/31/2020 appointment with the following noted: Increased sertraline to 75 mg daily at HS. New job [employer]  2 weeks ago and likes it better so far.  More physical. SE less sleepy.  No other SE. Took propranolol last week for presentation and it helped at 20 mg . When goes out with friends and family not as worried as much socially. Still anxious at work to ask questions bc doesn't want to look stupid.   School ending this week and will complete this school.  May decide to go further. Less worry causing procrastination. Plan: Switch sertraline to night and increase dose for better benefit for anxiety DT partial response. Increase to 100 mg  and after a month if tolerated for  better control can increase to 150 mg daily. continue increase propranolol 20-40 mg prn anxie  03/10/2021 appointment with the following noted: Increased sertraline 150 mg for about 6  weeks. Not much different.  Work is pretty easy socially.  Easier talking to people and good group of guys at work.   Still get sweaty and clammy socially.  Uses propranolol in that situation.  Usually just one.   Some issues with sleep lately.  Hard to stay asleep. Anxiety dropped from 8/10 to 45-5/10 with sertraline.  06/15/21 appt noted: Has tried propranolol 20 mg only before church. Promotion at work and got a new car.  Making progress in life. Still on sertraline 200 mg and it's been helpful for anxiety and definitely better with increase to 200 mg daily.  If forgets it then gets WD. SE  ok. No other concerns with meds.   Has friend who attempted suicide and he had to call police.  Big mental drain on him.    Past Psychiatric Medication Trials: Sertraline 200  PHQ2-9    Flowsheet Row [facility]  PHQ-2 Total Score 3 2 0  PHQ-9 Total Score 9 9 0        Review of Systems:  Review of Systems  Constitutional:  Positive for diaphoresis.  Gastrointestinal:  Negative for diarrhea.  Neurological:  Negative for tremors.  Psychiatric/Behavioral:  The patient is nervous/anxious.    Medications: I have reviewed the patient's current medications.  Current Outpatient Medications  Medication Sig Dispense Refill   EPINEPHrine (EPIPEN) 0.3 mg/0.3 mL DEVI Inject 0.3 mLs (0.3 mg total) into the muscle once. 1 Device 1   loratadine (CLARITIN) 5 MG chewable tablet Chew 5 mg by mouth daily.     propranolol (INDERAL) 20 MG tablet TAKE 1-2 TABLETS TWICE DAILY AS NEEDED FOR ANXIETY 120 tablet 2   sertraline (ZOLOFT) 100 MG tablet Take  2 tablets (200 mg total) by mouth daily. 180 tablet 0   No current facility-administered medications for this visit.    Medication Side Effects: Sedation  Allergies: No Known Allergies  Past Medical History:  Diagnosis Date   Migraine     Past Medical History, Surgical history, Social history, and Family history were reviewed and updated as appropriate.   Please see review of systems for further details on the patient's review from today.   Objective:   Physical Exam:  There were no vitals taken for this visit.  Physical Exam Constitutional:      General: He is not in acute distress. Musculoskeletal:        General: No deformity.  Neurological:     Mental Status: He is alert and oriented to person, place, and time.     Coordination: Coordination normal.  Psychiatric:        Attention and Perception: Attention and perception normal. He does not perceive auditory or visual hallucinations.        Mood and Affect: Mood is anxious. Mood is not depressed. Affect is not labile, blunt, angry or inappropriate.        Speech: Speech normal.        Behavior: Behavior normal.        Thought Content: Thought content normal. Thought content is not paranoid or delusional. Thought content does not include homicidal or suicidal ideation.        Cognition and Memory: Cognition and memory normal.        Judgment: Judgment normal.     Comments: Insight intact    Lab Review:     Component Value Date/Time   NA 138 05/25/2017 1405   K 3.6 05/25/2017 1405   CL 104 05/25/2017 1405   CO2 24 05/25/2017 1405   GLUCOSE 87 05/25/2017 1405   BUN 9 05/25/2017 1405   CREATININE 0.85 05/25/2017 1405   CALCIUM 8.9 05/25/2017 1405   PROT 7.6 05/25/2017 1405   ALBUMIN 4.7 05/25/2017 1405   AST 21 05/25/2017 1405   ALT 32 05/25/2017 1405   ALKPHOS 54 05/25/2017 1405   BILITOT 0.9 05/25/2017 1405   GFRNONAA NOT CALCULATED 05/25/2017 1405   GFRAA NOT CALCULATED 05/25/2017 1405       Component  Value Date/Time   WBC 8.4 05/25/2017 1405   RBC 5.21 05/25/2017 1405   HGB 16.3 (H) 05/25/2017 1405   HCT 47.3 05/25/2017 1405   PLT 267 05/25/2017 1405   MCV 90.8 05/25/2017 1405   MCH 31.3 05/25/2017 1405   MCHC 34.5 05/25/2017 1405   RDW 12.6 05/25/2017 1405   LYMPHSABS 2.0 03/31/2017 1158   MONOABS 0.6 03/31/2017 1158   EOSABS 0.2 03/31/2017 1158   BASOSABS 0.0 03/31/2017 1158    No results found for: POCLITH, LITHIUM   No results found for: PHENYTOIN, PHENOBARB, VALPROATE, CBMZ   .res Assessment: Plan:    Darry was seen today for follow-up and anxiety.  Diagnoses and  all orders for this visit:  Social anxiety disorder    Switch sertraline to night and increase dose for better benefit for anxiety with better response. 200 mg daily.  In general this works better at higher doses for anxiety  Disc SE in detail.  Disc SE in detail and SSRI withdrawal sx.  Disc shift work sleep issues.  He's going to bed 45 min after home from work  Disc dealing with friend with borderlne pd  increase propranolol 40 mg prn anxiety.  Disc SE  FU 4 mos  Meredith Staggers, MD, DFAPA    Please see After Visit Summary for patient specific instructions.  No future appointments.  No orders of the defined types were placed in this encounter.    -------------------------------

## 2021-08-24 ENCOUNTER — Ambulatory Visit: Payer: 59 | Admitting: Psychiatry

## 2021-12-03 ENCOUNTER — Encounter: Payer: Self-pay | Admitting: Family Medicine

## 2021-12-03 ENCOUNTER — Ambulatory Visit (INDEPENDENT_AMBULATORY_CARE_PROVIDER_SITE_OTHER): Payer: Managed Care, Other (non HMO) | Admitting: Family Medicine

## 2021-12-03 VITALS — BP 122/86 | HR 79 | Temp 97.7°F | Ht 68.11 in | Wt 250.8 lb

## 2021-12-03 DIAGNOSIS — Z0000 Encounter for general adult medical examination without abnormal findings: Secondary | ICD-10-CM

## 2021-12-03 DIAGNOSIS — Z23 Encounter for immunization: Secondary | ICD-10-CM | POA: Diagnosis not present

## 2021-12-03 LAB — HEPATIC FUNCTION PANEL
ALT: 87 U/L — ABNORMAL HIGH (ref 0–53)
AST: 36 U/L (ref 0–37)
Albumin: 4.6 g/dL (ref 3.5–5.2)
Alkaline Phosphatase: 52 U/L (ref 39–117)
Bilirubin, Direct: 0.1 mg/dL (ref 0.0–0.3)
Total Bilirubin: 0.4 mg/dL (ref 0.2–1.2)
Total Protein: 7.5 g/dL (ref 6.0–8.3)

## 2021-12-03 LAB — CBC WITH DIFFERENTIAL/PLATELET
Basophils Absolute: 0.1 10*3/uL (ref 0.0–0.1)
Basophils Relative: 0.7 % (ref 0.0–3.0)
Eosinophils Absolute: 0.3 10*3/uL (ref 0.0–0.7)
Eosinophils Relative: 3.5 % (ref 0.0–5.0)
HCT: 50 % (ref 39.0–52.0)
Hemoglobin: 16.5 g/dL (ref 13.0–17.0)
Lymphocytes Relative: 24.1 % (ref 12.0–46.0)
Lymphs Abs: 1.9 10*3/uL (ref 0.7–4.0)
MCHC: 33.1 g/dL (ref 30.0–36.0)
MCV: 90.5 fl (ref 78.0–100.0)
Monocytes Absolute: 0.7 10*3/uL (ref 0.1–1.0)
Monocytes Relative: 9.1 % (ref 3.0–12.0)
Neutro Abs: 4.9 10*3/uL (ref 1.4–7.7)
Neutrophils Relative %: 62.6 % (ref 43.0–77.0)
Platelets: 264 10*3/uL (ref 150.0–400.0)
RBC: 5.53 Mil/uL (ref 4.22–5.81)
RDW: 13.6 % (ref 11.5–15.5)
WBC: 7.8 10*3/uL (ref 4.0–10.5)

## 2021-12-03 LAB — BASIC METABOLIC PANEL
BUN: 10 mg/dL (ref 6–23)
CO2: 27 mEq/L (ref 19–32)
Calcium: 9.4 mg/dL (ref 8.4–10.5)
Chloride: 103 mEq/L (ref 96–112)
Creatinine, Ser: 0.96 mg/dL (ref 0.40–1.50)
GFR: 112.47 mL/min (ref >60.00)
Glucose, Bld: 82 mg/dL (ref 70–99)
Potassium: 4.4 mEq/L (ref 3.5–5.1)
Sodium: 138 mEq/L (ref 135–145)

## 2021-12-03 LAB — LIPID PANEL
Cholesterol: 181 mg/dL (ref 0–200)
HDL: 45 mg/dL (ref >39.00)
LDL Cholesterol: 119 mg/dL — ABNORMAL HIGH (ref 0–99)
NonHDL: 136.27
Total CHOL/HDL Ratio: 4
Triglycerides: 84 mg/dL (ref 0.0–149.0)
VLDL: 16.8 mg/dL (ref 0.0–40.0)

## 2021-12-03 LAB — TSH: TSH: 1.43 u[IU]/mL (ref 0.35–5.50)

## 2021-12-03 NOTE — Addendum Note (Signed)
Addended by: Christy Sartorius on: 12/03/2021 10:08 AM   Modules accepted: Orders

## 2021-12-03 NOTE — Progress Notes (Signed)
Established Patient Office Visit  Subjective   Patient ID: Isaac Carlson, male    DOB: 2000/10/24  Age: 21 y.o. MRN: [id number]  Chief Complaint  Patient presents with   Annual Exam    HPI   Bryen is seen for medical follow-up and physical exam.  Generally doing well.  He had some anxiety symptoms the past treated with sertraline.  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  Health maintenance reviewed  -Last tetanus over 10 years ago -Requesting flu vaccine today -Other health maintenance up-to-date  Social history-he is single.  Non-smoker.  No regular alcohol use.  Working at [employer] with plans to attend aviation school soon.  Family history-reviewed with no significant changes.  Parents are generally healthy.  No family history of type 2 diabetes, premature heart disease, or cancer.  Past Medical History:  Diagnosis Date   Migraine    Past Surgical History:  Procedure Laterality Date   TONSILLECTOMY     WISDOM TOOTH EXTRACTION      reports that he has never smoked. He has never used smokeless tobacco. He reports that he does not drink alcohol and does not use drugs. family history includes Anxiety disorder in his mother. No Known Allergies  Review of Systems  Constitutional:  Negative for chills, fever, malaise/fatigue and weight loss.  HENT:  Negative for hearing loss.   Eyes:  Negative for blurred vision and double vision.  Respiratory:  Negative for cough and shortness of breath.   Cardiovascular:  Negative for chest pain, palpitations and leg swelling.  Gastrointestinal:  Negative for abdominal pain, blood in stool, constipation and diarrhea.  Genitourinary:  Negative for dysuria.  Skin:  Negative for rash.  Neurological:  Negative for dizziness, speech change, seizures, loss of consciousness and headaches.  Psychiatric/Behavioral:  Negative for depression.        Objective:     BP 122/86 (BP Location: Left Arm, Patient Position: Sitting, Cuff Size: Large)   Pulse 79   Temp 97.7 F (36.5 C) (Oral)   Ht 5' 8.11" (1.73 m)   Wt 250 lb 12.8 oz (113.8 kg)   SpO2 97%   BMI 38.01 kg/m    Physical Exam Vitals reviewed.  Constitutional:      General: He is not in acute distress.    Appearance: He is well-developed.  HENT:     Head: Normocephalic and atraumatic.     Right Ear: External ear normal.     Left Ear: External ear normal.  Eyes:     Conjunctiva/sclera: Conjunctivae normal.     Pupils: Pupils are equal, round, and reactive to light.  Neck:     Thyroid: No thyromegaly.  Cardiovascular:     Rate and Rhythm: Normal rate and regular rhythm.     Heart sounds: Normal heart sounds. No murmur heard. Pulmonary:     Effort: No respiratory distress.     Breath sounds: No wheezing or rales.  Abdominal:     General: Bowel sounds are normal. There is no distension.     Palpations: Abdomen is soft. There is no mass.     Tenderness: There is no abdominal tenderness. There is no guarding or rebound.  Musculoskeletal:     Cervical back: Normal range of motion and neck supple.  Lymphadenopathy:     Cervical: No cervical adenopathy.  Skin:    Findings: No rash.  Neurological:     Mental  Status: He is alert and oriented to person, place, and time.     Cranial Nerves: No cranial nerve deficit.      No results found for any visits on 12/03/21.    The ASCVD Risk score (Arnett DK, et al., 2019) failed to calculate for the following reasons:   The 2019 ASCVD risk score is only valid for ages 39 to 10    Assessment & Plan:   Problem List Items Addressed This Visit   None Visit Diagnoses     Physical exam    -  Primary   Relevant Orders   Basic metabolic panel   Lipid panel   CBC with Differential/Platelet   Hepatic function panel   TSH   Need for influenza vaccination       Relevant Orders   Flu Vaccine QUAD 6+ mos PF IM  (Fluarix Quad PF) (Completed)     -Flu vaccine given -Tdap given -Obtain screening labs as above -We have strongly encouraged her to try to lose some weight.  We discussed combination of diet change with regular exercise.  We discussed strategies for weight loss including overall calorie reduction, reduction high glycemic foods, possible intermittent fasting, and regular exercise  No follow-ups on file.    Evelena Peat, MD

## 2021-12-08 ENCOUNTER — Other Ambulatory Visit: Payer: Self-pay

## 2021-12-08 DIAGNOSIS — R7401 Elevation of levels of liver transaminase levels: Secondary | ICD-10-CM

## 2021-12-21 ENCOUNTER — Encounter: Payer: Self-pay | Admitting: Psychiatry

## 2021-12-21 ENCOUNTER — Ambulatory Visit (INDEPENDENT_AMBULATORY_CARE_PROVIDER_SITE_OTHER): Payer: 59 | Admitting: Psychiatry

## 2021-12-21 DIAGNOSIS — F4010 Social phobia, unspecified: Secondary | ICD-10-CM | POA: Diagnosis not present

## 2021-12-21 MED ORDER — SERTRALINE HCL 100 MG PO TABS: 200.0000 mg | ORAL_TABLET | Freq: Every day | ORAL | 3 refills | Status: AC

## 2021-12-21 NOTE — Progress Notes (Signed)
Isaac Carlson [id number] 09-Jan-2001 21 y.o.  Subjective:   Patient ID:  Isaac Carlson is a 21 y.o. (DOB 2000/09/21) male.  Chief Complaint:  Chief Complaint  Patient presents with   Follow-up   Anxiety    HPI Isaac Carlson presents to the office today for follow-up of social anxiety disorder. Referred by a friend of his mothers, Isaac Carlson and previously saw Isaac Carlson [facility] for psychotherapy. Originally seen 08/31/2020.  10/21/2020 appointment with the following noted: At his first appointment he was prescribed sertraline 50 mg daily and propranolol 20 mg to 40 mg twice daily as needed anxiety. A little sleepy from sertraline 50 in AM.   Lately a little hard to stay asleep last 3 week but right back to sleep. 6-8 hours of sleep. Definitely at school anxiety is better esp walking around and into class.  Work is hectic and stress there will cause anxiety worse.  Anxiety gradually better. Has used propranolol an hour before work 20 mg with some benefit. Plan: Switch sertraline to night and increase dose for better benefit for anxiety DT partial response. Increase to 75 mg then 100 mg if tolerated for better control. Option increase propranolol 20-40 mg prn anxiety.  12/31/2020 appointment with the following noted: Increased sertraline to 75 mg daily at HS. New job [employer]  2 weeks ago and likes it better so far.  More physical. SE less sleepy.  No other SE. Took propranolol last week for presentation and it helped at 20 mg . When goes out with friends and family not as worried as much socially. Still anxious at work to ask questions bc doesn't want to look stupid.   School ending this week and will complete this school.  May decide to go further. Less worry causing procrastination. Plan: Switch sertraline to night and increase dose for better benefit for anxiety DT partial response. Increase to 100 mg  and after a month if tolerated for  better control can increase to 150 mg daily. continue increase propranolol 20-40 mg prn anxie  03/10/2021 appointment with the following noted: Increased sertraline 150 mg for about 6  weeks. Not much different.  Work is pretty easy socially.  Easier talking to people and good group of guys at work.   Still get sweaty and clammy socially.  Uses propranolol in that situation.  Usually just one.   Some issues with sleep lately.  Hard to stay asleep. Anxiety dropped from 8/10 to 45-5/10 with sertraline.  06/15/21 appt noted: Has tried propranolol 20 mg only before church. Promotion at work and got a new car.  Making progress in life. Still on sertraline 200 mg and it's been helpful for anxiety and definitely better with increase to 200 mg daily.  If forgets it then gets WD. SE  ok. No other concerns with meds.   Has friend who attempted suicide and he had to call police.  Big mental drain on him.   Plan: continue sertraline 200 mg daily. increase propranolol 40 mg prn anxiety.  12/21/21 appt noted: Stayed on sertraline 200 mg and rare propranolol 20-40 mg prn social anxiety. Propranolol helped social gatherings without sweating or shakes.  Took 40 mg that day before Thanksgiving. Managing anxiety pretty well overall.   Some general anxiety with upcoming school and work.  Anxiety about needles.   Hunts.   No SE concerns.  Past Psychiatric Medication Trials: Sertraline 200  PHQ2-9    Flowsheet Row Office Visit from 12/03/2021  in [place] [facility] at [facility] from 11/20/2020 in [place] at [facility] from 03/18/2019 in [place] at [facility] from 03/12/2018 in [place] at [facility] Total Score 2 3 2  0  PHQ-9 Total Score 7 9 9  0        Review of Systems:  Review of Systems  Constitutional:  Positive for diaphoresis.  Gastrointestinal:  Negative for diarrhea.  Neurological:  Negative for tremors.   Psychiatric/Behavioral:  The patient is nervous/anxious.     Medications: I have reviewed the patient's current medications.  Current Outpatient Medications  Medication Sig Dispense Refill   EPINEPHrine (EPIPEN) 0.3 mg/0.3 mL DEVI Inject 0.3 mLs (0.3 mg total) into the muscle once. 1 Device 1   loratadine (CLARITIN) 5 MG chewable tablet Chew 5 mg by mouth daily.     propranolol (INDERAL) 20 MG tablet TAKE 1-2 TABLETS TWICE DAILY AS NEEDED FOR ANXIETY 120 tablet 2   sertraline (ZOLOFT) 100 MG tablet Take 2 tablets (200 mg total) by mouth daily. 180 tablet 3   No current facility-administered medications for this visit.    Medication Side Effects: Sedation  Allergies: No Known Allergies  Past Medical History:  Diagnosis Date   Migraine     Past Medical History, Surgical history, Social history, and Family history were reviewed and updated as appropriate.   Please see review of systems for further details on the patient's review from today.   Objective:   Physical Exam:  There were no vitals taken for this visit.  Physical Exam Constitutional:      General: He is not in acute distress. Musculoskeletal:        General: No deformity.  Neurological:     Mental Status: He is alert and oriented to person, place, and time.     Coordination: Coordination normal.  Psychiatric:        Attention and Perception: Attention and perception normal. He does not perceive auditory or visual hallucinations.        Mood and Affect: Mood is not anxious or depressed. Affect is not labile, blunt or inappropriate.        Speech: Speech normal.        Behavior: Behavior normal.        Thought Content: Thought content normal. Thought content is not paranoid or delusional. Thought content does not include homicidal or suicidal ideation.        Cognition and Memory: Cognition and memory normal.        Judgment: Judgment normal.     Comments: Insight intact     Lab Review:     Component  Value Date/Time   NA 138 12/03/2021 0930   K 4.4 12/03/2021 0930   CL 103 12/03/2021 0930   CO2 27 12/03/2021 0930   GLUCOSE 82 12/03/2021 0930   BUN 10 12/03/2021 0930   CREATININE 0.96 12/03/2021 0930   CALCIUM 9.4 12/03/2021 0930   PROT 7.5 12/03/2021 0930   ALBUMIN 4.6 12/03/2021 0930   AST 36 12/03/2021 0930   ALT 87 (H) 12/03/2021 0930   ALKPHOS 52 12/03/2021 0930   BILITOT 0.4 12/03/2021 0930   GFRNONAA NOT CALCULATED 05/25/2017 1405   GFRAA NOT CALCULATED 05/25/2017 1405       Component Value Date/Time   WBC 7.8 12/03/2021 0930   RBC 5.53 12/03/2021 0930   HGB 16.5 12/03/2021 0930   HCT 50.0 12/03/2021 0930   PLT 264.0 12/03/2021 0930   MCV  90.5 12/03/2021 0930   MCH 31.3 05/25/2017 1405   MCHC 33.1 12/03/2021 0930   RDW 13.6 12/03/2021 0930   LYMPHSABS 1.9 12/03/2021 0930   MONOABS 0.7 12/03/2021 0930   EOSABS 0.3 12/03/2021 0930   BASOSABS 0.1 12/03/2021 0930    No results found for: "POCLITH", "LITHIUM"   No results found for: "PHENYTOIN", "PHENOBARB", "VALPROATE", "CBMZ"   .res Assessment: Plan:    Trevaughn was seen today for follow-up and anxiety.  Diagnoses and all orders for this visit:  Social anxiety disorder -     sertraline (ZOLOFT) 100 MG tablet; Take 2 tablets (200 mg total) by mouth daily.    Switched sertraline to night and increase dose for better benefit for anxiety with better response. 200 mg daily.  In general this works better at higher doses for anxiety  Disc SE in detail.  Disc SE in detail and SSRI withdrawal sx.  Disc shift work sleep issues.  He's going to bed 45 min after home from work  Disc dealing with friend with borderlne pd  continue propranolol 40 mg prn anxiety.  No med change needed  Disc SE  FU 1 year  Lynder Parents, MD, DFAPA    Please see After Visit Summary for patient specific instructions.  No future appointments.   No orders of the defined types were placed in this  encounter.    -------------------------------

## 2022-01-28 ENCOUNTER — Ambulatory Visit: Payer: Managed Care, Other (non HMO) | Admitting: Family Medicine

## 2022-02-04 ENCOUNTER — Other Ambulatory Visit: Payer: Self-pay

## 2022-02-04 ENCOUNTER — Encounter (HOSPITAL_BASED_OUTPATIENT_CLINIC_OR_DEPARTMENT_OTHER): Payer: Self-pay | Admitting: Emergency Medicine

## 2022-02-04 ENCOUNTER — Emergency Department (HOSPITAL_BASED_OUTPATIENT_CLINIC_OR_DEPARTMENT_OTHER): Payer: 59

## 2022-02-04 ENCOUNTER — Emergency Department (HOSPITAL_BASED_OUTPATIENT_CLINIC_OR_DEPARTMENT_OTHER)
Admission: EM | Admit: 2022-02-04 | Discharge: 2022-02-04 | Disposition: A | Discharge status: Home / Self Care | Payer: 59 | Attending: Emergency Medicine | Admitting: Emergency Medicine

## 2022-02-04 DIAGNOSIS — R059 Cough, unspecified: Secondary | ICD-10-CM | POA: Insufficient documentation

## 2022-02-04 DIAGNOSIS — R1013 Epigastric pain: Secondary | ICD-10-CM | POA: Insufficient documentation

## 2022-02-04 DIAGNOSIS — R0789 Other chest pain: Secondary | ICD-10-CM | POA: Insufficient documentation

## 2022-02-04 DIAGNOSIS — Z1152 Encounter for screening for COVID-19: Secondary | ICD-10-CM | POA: Insufficient documentation

## 2022-02-04 DIAGNOSIS — R051 Acute cough: Secondary | ICD-10-CM

## 2022-02-04 LAB — LIPASE, BLOOD: Lipase: 116 U/L — ABNORMAL HIGH (ref 11–51)

## 2022-02-04 LAB — COMPREHENSIVE METABOLIC PANEL
ALT: 65 U/L — ABNORMAL HIGH (ref 0–44)
AST: 38 U/L (ref 15–41)
Albumin: 3.8 g/dL (ref 3.5–5.0)
Alkaline Phosphatase: 44 U/L (ref 38–126)
Anion gap: 12 (ref 5–15)
BUN: 10 mg/dL (ref 6–20)
CO2: 24 mmol/L (ref 22–32)
Calcium: 8.6 mg/dL — ABNORMAL LOW (ref 8.9–10.3)
Chloride: 101 mmol/L (ref 98–111)
Creatinine, Ser: 0.96 mg/dL (ref 0.61–1.24)
GFR, Estimated: >60 mL/min (ref >60)
Glucose, Bld: 96 mg/dL (ref 70–99)
Potassium: 3.4 mmol/L — ABNORMAL LOW (ref 3.5–5.1)
Sodium: 137 mmol/L (ref 135–145)
Total Bilirubin: 1.1 mg/dL (ref 0.3–1.2)
Total Protein: 7.9 g/dL (ref 6.5–8.1)

## 2022-02-04 LAB — CBC
HCT: 49.3 % (ref 39.0–52.0)
Hemoglobin: 16.8 g/dL (ref 13.0–17.0)
MCH: 29.6 pg (ref 26.0–34.0)
MCHC: 34.1 g/dL (ref 30.0–36.0)
MCV: 86.9 fL (ref 80.0–100.0)
Platelets: 229 10*3/uL (ref 150–400)
RBC: 5.67 MIL/uL (ref 4.22–5.81)
RDW: 12.5 % (ref 11.5–15.5)
WBC: 9.2 10*3/uL (ref 4.0–10.5)
nRBC: 0 % (ref 0.0–0.2)

## 2022-02-04 LAB — URINALYSIS, MICROSCOPIC (REFLEX)

## 2022-02-04 LAB — URINALYSIS, ROUTINE W REFLEX MICROSCOPIC
Glucose, UA: NEGATIVE mg/dL
Ketones, ur: 40 mg/dL — AB
Leukocytes,Ua: NEGATIVE
Nitrite: NEGATIVE
Protein, ur: 100 mg/dL — AB
Specific Gravity, Urine: >=1.03 (ref 1.005–1.030)
pH: 6 (ref 5.0–8.0)

## 2022-02-04 LAB — RESP PANEL BY RT-PCR (RSV, FLU A&B, COVID)  RVPGX2
Influenza A by PCR: NEGATIVE
Influenza B by PCR: NEGATIVE
Resp Syncytial Virus by PCR: NEGATIVE
SARS Coronavirus 2 by RT PCR: NEGATIVE

## 2022-02-04 MED ORDER — ALUM & MAG HYDROXIDE-SIMETH 200-200-20 MG/5ML PO SUSP
30.0000 mL | Freq: Once | ORAL | Status: AC
  Administered 2022-02-04: 30 mL via ORAL
  Filled 2022-02-04: qty 30

## 2022-02-04 MED ORDER — SODIUM CHLORIDE 0.9 % IV BOLUS
1000.0000 mL | Freq: Once | INTRAVENOUS | Status: AC
  Administered 2022-02-04: 1000 mL via INTRAVENOUS

## 2022-02-04 MED ORDER — LIDOCAINE VISCOUS HCL 2 % MT SOLN
15.0000 mL | Freq: Once | OROMUCOSAL | Status: AC
  Administered 2022-02-04: 15 mL via ORAL
  Filled 2022-02-04: qty 15

## 2022-02-04 MED ORDER — PANTOPRAZOLE SODIUM 40 MG PO TBEC: 40.0000 mg | DELAYED_RELEASE_TABLET | Freq: Every day | ORAL | 0 refills | Status: AC

## 2022-02-04 MED ORDER — IOHEXOL 300 MG/ML  SOLN
100.0000 mL | Freq: Once | INTRAMUSCULAR | Status: AC | PRN
  Administered 2022-02-04: 100 mL via INTRAVENOUS

## 2022-02-04 MED ORDER — HYDROCODONE-ACETAMINOPHEN 5-325 MG PO TABS: 1.0000 | ORAL_TABLET | Freq: Four times a day (QID) | ORAL | 0 refills | Status: AC | PRN

## 2022-02-04 MED ORDER — KETOROLAC TROMETHAMINE 15 MG/ML IJ SOLN
15.0000 mg | Freq: Once | INTRAMUSCULAR | Status: AC
  Administered 2022-02-04: 15 mg via INTRAVENOUS
  Filled 2022-02-04: qty 1

## 2022-02-04 NOTE — Discharge Instructions (Addendum)
Today for cough and abdominal pain and pain with swallowing.  Your blood work showed some elevation in your pancreatic enzyme.  This could be incidental or could be early pancreatitis.  Follow the eating plan as discussed and use pain medication as needed.  Your CT scan did show some fatty liver.  It is importantly follow-up with the stomach doctor.  You are also given prescription for an acid reducing medicine to help with the pain in your esophagus which could be due to acid reflux.  Come back to the ER for any new or worsening symptoms.

## 2022-02-04 NOTE — ED Triage Notes (Signed)
Epigastric pain , sharp sternal pain he said , Also reports dry cough . Reports was tested for negative covid and flu . Painful shortness of breath

## 2022-02-04 NOTE — ED Provider Notes (Signed)
[facility] EMERGENCY DEPARTMENT Provider Note   CSN: [id number] Arrival date & time: 02/04/22  0854     History  Chief Complaint  Patient presents with   Abdominal Pain    epigastric    Isaac Carlson is a 22 y.o. male.  He has past medical history of anxiety, no other chronic medical problems.  He presents the ER complaining of epigastric pain, cough and chest pain with coughing and deep breath.  Started 6 days ago and until yesterday he had fevers with Tmax of 101.7 Fahrenheit.  He had a negative home COVID test and went to his PCP and had rapid COVID and flu test which were negative.  Symptoms are continued.  He states he having a lot of pain in the epigastric and mid chest with swallowing and has been not eating or drinking very much.  Denies any purulent sputum production or hemoptysis.  He states he had some vomiting until yesterday but is mostly been stomach acid and mucus.  He does complain of some sinus congestion and drainage.  No dizziness palpitations syncope or any other symptoms.   Abdominal Pain Associated symptoms: cough   Associated symptoms: no shortness of breath        Home Medications Prior to Admission medications   Medication Sig Start Date End Date Taking? Authorizing Provider  EPINEPHrine (EPIPEN) 0.3 mg/0.3 mL DEVI Inject 0.3 mLs (0.3 mg total) into the muscle once. 07/28/11   Cioffredi, Leigh-Anne, MD  loratadine (CLARITIN) 5 MG chewable tablet Chew 5 mg by mouth daily.    [provider]  propranolol (INDERAL) 20 MG tablet TAKE 1-2 TABLETS TWICE DAILY AS NEEDED FOR ANXIETY 10/25/20   Cottle, Billey [employer]., MD  sertraline (ZOLOFT) 100 MG tablet Take 2 tablets (200 mg total) by mouth daily. 12/21/21   Cottle, Billey Co., MD      Allergies    Patient has no known allergies.    Review of Systems   Review of Systems  Respiratory:  Positive for cough. Negative for shortness of breath.   Gastrointestinal:  Positive for abdominal  pain.    Physical Exam Updated Vital Signs BP (!) 141/79   Pulse 92   Temp 99.2 F (37.3 C)   Resp (!) 24   Ht 5\' 9"  (1.753 m)   Wt 99.8 kg   SpO2 97%   BMI 32.49 kg/m  Physical Exam Vitals and nursing note reviewed.  Constitutional:      General: He is not in acute distress.    Appearance: He is well-developed.  HENT:     Head: Normocephalic and atraumatic.  Eyes:     Conjunctiva/sclera: Conjunctivae normal.  Cardiovascular:     Rate and Rhythm: Normal rate and regular rhythm.     Heart sounds: No murmur heard. Pulmonary:     Effort: Pulmonary effort is normal. No respiratory distress.     Breath sounds: Normal breath sounds.  Chest:     Chest wall: Tenderness present. No swelling, crepitus or edema.     Comments: Tenderness is diffuse and reproduces pain completely Abdominal:     Palpations: Abdomen is soft.     Tenderness: There is abdominal tenderness in the epigastric area. There is no right CVA tenderness or left CVA tenderness. Negative signs include Murphy's sign, Rovsing's sign and McBurney's sign.  Musculoskeletal:        General: No swelling.     Cervical back: Neck supple.  Skin:  General: Skin is warm and dry.     Capillary Refill: Capillary refill takes less than 2 seconds.  Neurological:     Mental Status: He is alert.  Psychiatric:        Mood and Affect: Mood normal.     ED Results / Procedures / Treatments   Labs (all labs ordered are listed, but only abnormal results are displayed) Labs Reviewed  LIPASE, BLOOD  COMPREHENSIVE METABOLIC PANEL  CBC  URINALYSIS, ROUTINE W REFLEX MICROSCOPIC    EKG EKG Interpretation  Date/Time:  Friday February 04 2022 09:05:16 EST Ventricular Rate:  86 PR Interval:  133 QRS Duration: 108 QT Interval:  358 QTC Calculation: 429 R Axis:   61 Text Interpretation: Sinus rhythm RSR' in V1 or V2, right VCD or RVH normal, no old comparison Confirmed by Charlesetta Shanks [phone]) on 02/04/2022 9:10:53  AM  Radiology No results found.  Procedures Procedures    Medications Ordered in ED Medications  sodium chloride 0.9 % bolus 1,000 mL (has no administration in time range)  alum & mag hydroxide-simeth (MAALOX/MYLANTA) 200-200-20 MG/5ML suspension 30 mL (has no administration in time range)    And  lidocaine (XYLOCAINE) 2 % viscous mouth solution 15 mL (has no administration in time range)  ketorolac (TORADOL) 15 MG/ML injection 15 mg (has no administration in time range)    ED Course/ Medical Decision Making/ A&P Clinical Course as of 02/04/22 1423  Fri Feb 04, 2022  1123 Lipase(!): 116 [CB]  1124 Lipase, blood(!) [CB]    Clinical Course User Index [CB] Gwenevere Abbot, PA-C                           Medical Decision Making This patient presents to the ED for concern of cough, epigastric pain and chest pain, this involves an extensive number of treatment options, and is a complaint that carries with it a high risk of complications and morbidity.  The differential diagnosis includes pneumonia, influenza, COVID-19, bronchitis, GERD, gastritis,   Co morbidities that complicate the patient evaluation  Anxiety   Additional history obtained:  Additional history obtained from the medical record External records from outside source obtained and reviewed including previous outpatient visits for anxiety   Lab Tests:  I Ordered, and personally interpreted labs.  The pertinent results include:    Imaging Studies ordered:  I ordered imaging studies including CT abdomen pelvis I independently visualized and interpreted imaging which showed no signs of pancreatitis, no biliary obstruction I agree with the radiologist interpretation  Chest x-ray also interpreted by me no pulmonary edema or infiltrate, I agree with radiologist interpretation   Cardiac Monitoring: / EKG:  The patient was maintained on a cardiac monitor.  I personally viewed and interpreted the cardiac  monitored which showed an underlying rhythm of: Sinus rhythm     Problem List / ED Course / Critical interventions / Medication management  Cough-normal chest x-ray, mild chest wall tenderness diffusely.  ECG reassuring.  Likely viral.  Negative COVID flu and RSV.  Patient advised on supportive care  Abdominal pain-patient got much symptomatic relief with GI cocktail suggesting possibly a GERD/gastritis element.  His lipase was elevated but not quite 3 times the upper limit of normal.  CT obtained after discussing risks and benefits with patient and his mother and they are agreeable with this.  There are no signs of pancreatitis, gallbladder disease or any other acute findings, he was advised of  the finding of likely fatty liver infiltration.  We discussed this could be incidental finding of elevated lipase or could be early pancreatitis.  Discussed diet as tolerated, analgesics as needed and outpatient GI follow-up and strict return precautions.  Patient was agreeable plan of care, care discussed with attending prior to patient's discharge   I ordered medication including Maalox and lidocaine for epigastric pain Reevaluation of the patient after these medicines showed that the patient improved I have reviewed the patients home medicines and have made adjustments as needed      Amount and/or Complexity of Data Reviewed Labs: ordered. Decision-making details documented in ED Course. Radiology: ordered.  Risk OTC drugs. Prescription drug management.           Final Clinical Impression(s) / ED Diagnoses Final diagnoses:  None    Rx / DC Orders ED Discharge Orders     None         Josem Kaufmann 02/04/22 1537    Arby Barrette, MD 02/08/22 1427

## 2022-02-08 ENCOUNTER — Encounter: Payer: Self-pay | Admitting: Internal Medicine

## 2022-03-01 ENCOUNTER — Ambulatory Visit (INDEPENDENT_AMBULATORY_CARE_PROVIDER_SITE_OTHER): Payer: 59 | Admitting: Internal Medicine

## 2022-03-01 ENCOUNTER — Encounter: Payer: Self-pay | Admitting: Internal Medicine

## 2022-03-01 VITALS — BP 118/72 | HR 79 | Ht 69.0 in | Wt 240.0 lb

## 2022-03-01 DIAGNOSIS — R1310 Dysphagia, unspecified: Secondary | ICD-10-CM | POA: Diagnosis not present

## 2022-03-01 DIAGNOSIS — R1319 Other dysphagia: Secondary | ICD-10-CM | POA: Diagnosis not present

## 2022-03-01 DIAGNOSIS — K219 Gastro-esophageal reflux disease without esophagitis: Secondary | ICD-10-CM

## 2022-03-01 NOTE — Patient Instructions (Signed)
You have been scheduled for an endoscopy. Please follow written instructions given to you at your visit today. If you use inhalers (even only as needed), please bring them with you on the day of your procedure.  _______________________________________________________  If your blood pressure at your visit was 140/90 or greater, please contact your primary care physician to follow up on this.  _______________________________________________________  If you are age 23 or older, your body mass index should be between 23-30. Your Body mass index is 35.44 kg/m. If this is out of the aforementioned range listed, please consider follow up with your Primary Care Provider.  If you are age 57 or younger, your body mass index should be between 19-25. Your Body mass index is 35.44 kg/m. If this is out of the aformentioned range listed, please consider follow up with your Primary Care Provider.   ________________________________________________________  The [facility] providers would like to encourage you to use [facility] to communicate with providers for non-urgent requests or questions.  Due to long hold times on the telephone, sending your provider a message by [facility] may be a faster and more efficient way to get a response.  Please allow 48 business hours for a response.  Please remember that this is for non-urgent requests.  _______________________________________________________  Thank you for choosing me and [facility].  Dr. Scarlette Shorts

## 2022-03-01 NOTE — Progress Notes (Signed)
HISTORY OF PRESENT ILLNESS:  Isaac Carlson is a 22 y.o. 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  Patient was in his usual state of good health until early January 2024 when he developed problems with malaise and fevers.  Subsequently developed fever blisters.  Was seen in the ER for severe dyne aphasia was persistent.  He felt dehydrated.  Testing for flu and COVID were negative.  He was apparently treated for influenza as well as given pain medication.  He was also placed on pantoprazole.  He is feeling better at this time.  He is accompanied by his mother.  Prior to these problems patient reports rare reflux symptoms such as heartburn with dietary indiscretion.  Possibly some mild solid food dysphagia.  No prior GI history of GI evaluations.  GI review of systems is otherwise negative.  Review of data from February 04, 2022 shows no significant abnormalities of the comprehensive metabolic panel.  His ALT was 65.  CBC was normal.  Lipase slightly elevated at 116.  CT scan of the abdomen and pelvis on that same date revealed fatty liver.  Nonobstructive left-sided kidney stone.  Otherwise normal.  REVIEW OF SYSTEMS:  All non-GI ROS negative unless otherwise stated in the HPI except for anxiety, fatigue, headaches, sore throat  Past Medical History:  Diagnosis Date   Migraine     Past Surgical History:  Procedure Laterality Date   TONSILLECTOMY     WISDOM TOOTH EXTRACTION      Social History [place]  reports that he has never smoked. He has never used smokeless tobacco. He reports that he does not drink alcohol and does not use drugs.  family history includes Anxiety disorder in his mother.  No Known Allergies     PHYSICAL EXAMINATION: Vital signs: BP 118/72   Pulse 79   Ht 5\' 9"  (1.753 m)   Wt 240 lb (108.9 kg)   BMI 35.44 kg/m   Constitutional: generally  well-appearing, no acute distress Psychiatric: alert and oriented x3, cooperative Eyes: extraocular movements intact, anicteric, conjunctiva pink Mouth: oral pharynx moist, no lesions Neck: supple no lymphadenopathy Cardiovascular: heart regular rate and rhythm, no murmur Lungs: clear to auscultation bilaterally Abdomen: soft, nontender, nondistended, no obvious ascites, no peritoneal signs, normal bowel sounds, no organomegaly Rectal: Omitted Extremities: no clubbing, cyanosis, or lower extremity edema bilaterally Skin: no lesions on visible extremities Neuro: No focal deficits.  Cranial nerves intact  ASSESSMENT:  1.  Acute odynophagia.  Suspect herpetic esophagitis with concurrent oral hepatic eruption.  Resolved 2.  GERD.  Intermittent mild symptoms.  Currently on pantoprazole.  No symptoms 3.  Dysphagia.  Possibly mild esophageal, by history. 4.  Obesity 5.  Fatty liver on CT 6.  Mildly elevated ALT.  Likely fatty liver   PLAN:  1.  Reflux precautions 2.  Continue pantoprazole for now 3.  Exercise and weight loss 4.  Schedule upper endoscopy to evaluate odynophagia, GERD, dysphagia.The nature of the procedure, as well as the risks, benefits, and alternatives were carefully and thoroughly reviewed with the patient. Ample time for discussion and questions allowed. The patient understood, was satisfied, and agreed to proceed.  A total time of 45 minutes was spent preparing to see the patient, reviewing ER evaluation, x-rays, and laboratories.  Obtaining comprehensive history, performing medically appropriate physical examination, counseling and educating the patient and his mother regarding the above listed issues,  ordering endoscopic procedure, and documenting clinical information in the health record

## 2022-04-08 ENCOUNTER — Encounter: Payer: Managed Care, Other (non HMO) | Admitting: Internal Medicine

## 2022-12-26 ENCOUNTER — Ambulatory Visit: Payer: 59 | Admitting: Psychiatry
